# Patient Record
Sex: Male | Born: 1940 | Race: Asian | Hispanic: No | Marital: Married | Smoking: Former smoker
Health system: Southern US, Community
[De-identification: ages and names within clinical notes are randomized; demographics above are authoritative.]

## PROBLEM LIST (undated history)

## (undated) DIAGNOSIS — K219 Gastro-esophageal reflux disease without esophagitis: Secondary | ICD-10-CM

## (undated) DIAGNOSIS — I10 Essential (primary) hypertension: Secondary | ICD-10-CM

## (undated) DIAGNOSIS — E119 Type 2 diabetes mellitus without complications: Secondary | ICD-10-CM

## (undated) DIAGNOSIS — E781 Pure hyperglyceridemia: Secondary | ICD-10-CM

## (undated) DIAGNOSIS — C220 Liver cell carcinoma: Secondary | ICD-10-CM

## (undated) DIAGNOSIS — B181 Chronic viral hepatitis B without delta-agent: Secondary | ICD-10-CM

## (undated) DIAGNOSIS — D890 Polyclonal hypergammaglobulinemia: Secondary | ICD-10-CM

## (undated) DIAGNOSIS — H3530 Unspecified macular degeneration: Secondary | ICD-10-CM

## (undated) HISTORY — PX: ABDOMINAL SURGERY: SHX537

---

## 2017-05-10 ENCOUNTER — Ambulatory Visit
Admission: RE | Admit: 2017-05-10 | Discharge: 2017-05-10 | Disposition: A | Discharge status: Home / Self Care | Payer: Medicare Other | Source: Ambulatory Visit | Attending: Family Medicine | Admitting: Family Medicine

## 2017-05-10 ENCOUNTER — Other Ambulatory Visit: Payer: Self-pay | Admitting: Family Medicine

## 2017-05-10 DIAGNOSIS — M25551 Pain in right hip: Secondary | ICD-10-CM | POA: Diagnosis present

## 2019-12-01 ENCOUNTER — Emergency Department: Payer: Medicare Other

## 2019-12-01 ENCOUNTER — Emergency Department
Admission: EM | Admit: 2019-12-01 | Discharge: 2019-12-01 | Disposition: A | Discharge status: Home / Self Care | Payer: Medicare Other | Attending: Emergency Medicine | Admitting: Emergency Medicine

## 2019-12-01 ENCOUNTER — Other Ambulatory Visit: Payer: Self-pay

## 2019-12-01 DIAGNOSIS — Z8505 Personal history of malignant neoplasm of liver: Secondary | ICD-10-CM | POA: Insufficient documentation

## 2019-12-01 DIAGNOSIS — R109 Unspecified abdominal pain: Secondary | ICD-10-CM | POA: Diagnosis present

## 2019-12-01 DIAGNOSIS — K219 Gastro-esophageal reflux disease without esophagitis: Secondary | ICD-10-CM | POA: Diagnosis not present

## 2019-12-01 DIAGNOSIS — Z87891 Personal history of nicotine dependence: Secondary | ICD-10-CM | POA: Diagnosis not present

## 2019-12-01 DIAGNOSIS — I10 Essential (primary) hypertension: Secondary | ICD-10-CM | POA: Diagnosis not present

## 2019-12-01 DIAGNOSIS — E119 Type 2 diabetes mellitus without complications: Secondary | ICD-10-CM | POA: Diagnosis not present

## 2019-12-01 HISTORY — DX: Gastro-esophageal reflux disease without esophagitis: K21.9

## 2019-12-01 HISTORY — DX: Liver cell carcinoma: C22.0

## 2019-12-01 HISTORY — DX: Unspecified macular degeneration: H35.30

## 2019-12-01 HISTORY — DX: Polyclonal hypergammaglobulinemia: D89.0

## 2019-12-01 HISTORY — DX: Pure hyperglyceridemia: E78.1

## 2019-12-01 HISTORY — DX: Essential (primary) hypertension: I10

## 2019-12-01 HISTORY — DX: Type 2 diabetes mellitus without complications: E11.9

## 2019-12-01 HISTORY — DX: Chronic viral hepatitis B without delta-agent: B18.1

## 2019-12-01 LAB — URINALYSIS, COMPLETE (UACMP) WITH MICROSCOPIC
Bacteria, UA: NONE SEEN
Bilirubin Urine: NEGATIVE
Glucose, UA: >=500 mg/dL — AB
Hgb urine dipstick: NEGATIVE
Ketones, ur: NEGATIVE mg/dL
Leukocytes,Ua: NEGATIVE
Nitrite: NEGATIVE
Protein, ur: NEGATIVE mg/dL
Specific Gravity, Urine: 1.025 (ref 1.005–1.030)
Squamous Epithelial / HPF: NONE SEEN (ref 0–5)
pH: 6 (ref 5.0–8.0)

## 2019-12-01 LAB — COMPREHENSIVE METABOLIC PANEL
ALT: 34 U/L (ref 0–44)
AST: 33 U/L (ref 15–41)
Albumin: 4.2 g/dL (ref 3.5–5.0)
Alkaline Phosphatase: 92 U/L (ref 38–126)
Anion gap: 9 (ref 5–15)
BUN: 16 mg/dL (ref 8–23)
CO2: 25 mmol/L (ref 22–32)
Calcium: 9.2 mg/dL (ref 8.9–10.3)
Chloride: 100 mmol/L (ref 98–111)
Creatinine, Ser: 0.93 mg/dL (ref 0.61–1.24)
GFR calc Af Amer: >60 mL/min (ref >60)
GFR calc non Af Amer: >60 mL/min (ref >60)
Glucose, Bld: 236 mg/dL — ABNORMAL HIGH (ref 70–99)
Potassium: 4.1 mmol/L (ref 3.5–5.1)
Sodium: 134 mmol/L — ABNORMAL LOW (ref 135–145)
Total Bilirubin: 0.9 mg/dL (ref 0.3–1.2)
Total Protein: 8.5 g/dL — ABNORMAL HIGH (ref 6.5–8.1)

## 2019-12-01 LAB — CBC
HCT: 45 % (ref 39.0–52.0)
Hemoglobin: 16.1 g/dL (ref 13.0–17.0)
MCH: 30.4 pg (ref 26.0–34.0)
MCHC: 35.8 g/dL (ref 30.0–36.0)
MCV: 85.1 fL (ref 80.0–100.0)
Platelets: 245 10*3/uL (ref 150–400)
RBC: 5.29 MIL/uL (ref 4.22–5.81)
RDW: 12.7 % (ref 11.5–15.5)
WBC: 9.2 10*3/uL (ref 4.0–10.5)
nRBC: 0 % (ref 0.0–0.2)

## 2019-12-01 LAB — LIPASE, BLOOD: Lipase: 40 U/L (ref 11–51)

## 2019-12-01 MED ORDER — IOHEXOL 9 MG/ML PO SOLN
500.0000 mL | Freq: Once | ORAL | Status: AC
  Administered 2019-12-01: 500 mL via ORAL
  Filled 2019-12-01: qty 500

## 2019-12-01 MED ORDER — SODIUM CHLORIDE 0.9 % IV BOLUS
1000.0000 mL | Freq: Once | INTRAVENOUS | Status: AC
  Administered 2019-12-01: 1000 mL via INTRAVENOUS

## 2019-12-01 MED ORDER — DICYCLOMINE HCL 10 MG PO CAPS: 10.0000 mg | ORAL_CAPSULE | Freq: Three times a day (TID) | ORAL | 0 refills | Status: AC

## 2019-12-01 MED ORDER — IOHEXOL 300 MG/ML  SOLN
100.0000 mL | Freq: Once | INTRAMUSCULAR | Status: AC | PRN
  Administered 2019-12-01: 75 mL via INTRAVENOUS
  Filled 2019-12-01: qty 100

## 2019-12-01 NOTE — ED Triage Notes (Signed)
Pt c/o generalized abd pain for the past 3 days, denies N/V/D.Marland Kitchen denies any constipation or abd swelling.

## 2019-12-01 NOTE — ED Provider Notes (Signed)
[facility] Emergency Department Provider Note  Time seen: 1:19 PM  I have reviewed the triage vital signs and the nursing notes.   HISTORY  Chief Complaint Abdominal Pain   HPI Brian Warner is a 79 y.o. male with a past medical history of diabetes, gastric reflux, hypertension, presents to the emergency department for left-sided abdominal pain.  According to the patient for the past 10 days or so he has been experiencing moderate dull aching pain along the left lower quadrant and now extending up to the left upper quadrant.  Patient denies any nausea vomiting or diarrhea.  No fever.  Last bowel movement was yesterday and normal per patient.  Patient does have a history of hepatocellular carcinoma status post partial resection approximately 5 years ago.  Currently rates his pain as a 5/10 dull aching pain.   Past Medical History:  Diagnosis Date  . 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 Diabetes mellitus without complication ([place])   . GERD (gastroesophageal reflux disease)   . Hepatocellular carcinoma ([place])   . Hypertension   . Hypertriglyceridemia   . Macular degeneration   . Polyclonal gammopathy     There are no problems to display for this patient.   Past Surgical History:  Procedure Laterality Date  . ABDOMINAL SURGERY     cancer removal     Prior to Admission medications   Not on File    Allergies  Allergen Reactions  . Statins Other (See Comments)    Liver cirrhosis  . Azithromycin Rash  . Fenofibrate Rash    No family history on file.  Social History Social History   Tobacco Use  . Smoking status: Former Research scientist (life sciences)  . Smokeless tobacco: Never Used  Substance Use Topics  . Alcohol use: Not Currently  . Drug use: Not Currently    Review of Systems Constitutional: Negative for fever. Cardiovascular: Negative for chest pain. Respiratory: Negative for shortness of breath. Gastrointestinal: Negative for abdominal pain,  vomiting  Musculoskeletal: Negative for musculoskeletal complaints Neurological: Negative for headache All other ROS negative  ____________________________________________   PHYSICAL EXAM:  VITAL SIGNS: ED Triage Vitals  Enc Vitals Group     BP 12/01/19 1036 (!) 153/90     Pulse Rate 12/01/19 1036 88     Resp 12/01/19 1036 16     Temp 12/01/19 1036 97.7 F (36.5 C)     Temp Source 12/01/19 1036 Oral     SpO2 12/01/19 1036 97 %     Weight 12/01/19 1037 123 lb (55.8 kg)     Height 12/01/19 1037 5\' 3"  (1.6 m)     Head Circumference --      Peak Flow --      Pain Score 12/01/19 1037 8     Pain Loc --      Pain Edu? --      Excl. in [place]? --    Constitutional: Alert and oriented. Well appearing and in no distress. Eyes: Normal exam ENT      Head: Normocephalic and atraumatic.      Mouth/Throat: Mucous membranes are moist. Cardiovascular: Normal rate, regular rhythm. No murmur Respiratory: Normal respiratory effort without tachypnea nor retractions. Breath sounds are clear Gastrointestinal: Soft and nontender. No distention.   Musculoskeletal: Nontender with normal range of motion in all extremities.  Neurologic:  Normal speech and language. No gross focal neurologic deficits  Skin:  Skin is warm, dry and intact.  Psychiatric: Mood and affect  are normal.   ____________________________________________    EKG  EKG viewed and interpreted by myself shows normal sinus rhythm 85 bpm with a narrow QRS, normal axis, normal intervals, no concerning ST changes.  ____________________________________________    RADIOLOGY  IMPRESSION:  1. Nodular hepatic margins are noted suggesting hepatic cirrhosis.  2. No other abnormality seen in the abdomen or pelvis.  3. Aortic atherosclerosis.   ____________________________________________   INITIAL IMPRESSION / ASSESSMENT AND PLAN / ED COURSE  Pertinent labs & imaging results that were available during my care of the patient were  reviewed by me and considered in my medical decision making (see chart for details).   Patient presents emergency department for left lower quadrant/left-sided abdominal pain ongoing for 10 days.  Differential would include colitis, diverticulitis, ureterolithiasis, UTI pyelonephritis, possible cancerous or tumor.  We will obtain CT scan with contrast abdomen/pelvis to further evaluate.  Patient agreeable to plan of care.  Patient CT scan is negative.  Overall patient appears extremely well.  We will discharge with Bentyl and have the patient follow-up with his doctor.  Patient agreeable to plan of care.  Brian Warner was evaluated in Emergency Department on 12/01/2019 for the symptoms described in the history of present illness. He was evaluated in the context of the global COVID-19 pandemic, which necessitated consideration that the patient might be at risk for infection with the SARS-CoV-2 virus that causes COVID-19. Institutional protocols and algorithms that pertain to the evaluation of patients at risk for COVID-19 are in a state of rapid change based on information released by regulatory bodies including the CDC and federal and state organizations. These policies and algorithms were followed during the patient's care in the ED.  ____________________________________________   FINAL CLINICAL IMPRESSION(S) / ED DIAGNOSES  Left-sided abdominal pain   Harvest Dark, MD 12/01/19 1555

## 2021-03-17 IMAGING — CT CT ABD-PELV W/ CM
2 of 5 series · 16 of 46 positions shown, 18 images · IV contrast (APPLIED)
Comparison: None.

CLINICAL DATA: Acute generalized abdominal pain.

EXAM:
CT ABDOMEN AND PELVIS WITH CONTRAST
TECHNIQUE: Multidetector CT imaging of the abdomen and pelvis was performed
using the standard protocol following bolus administration of
intravenous contrast.
CONTRAST:  75mL OMNIPAQUE IOHEXOL 300 MG/ML  SOLN

[Series 2: routine abd/pel with · axial · 0.73mm/px · z∈[-779,-399]mm · 13 of 86 slices shown, 15 images]
[im 5/86  soft-tissue]
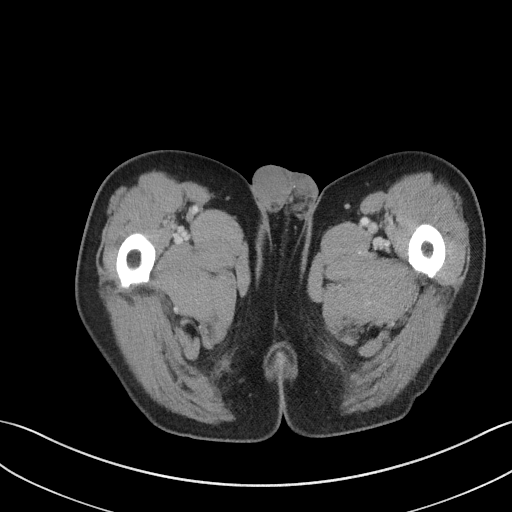
[im 5/86  bone]
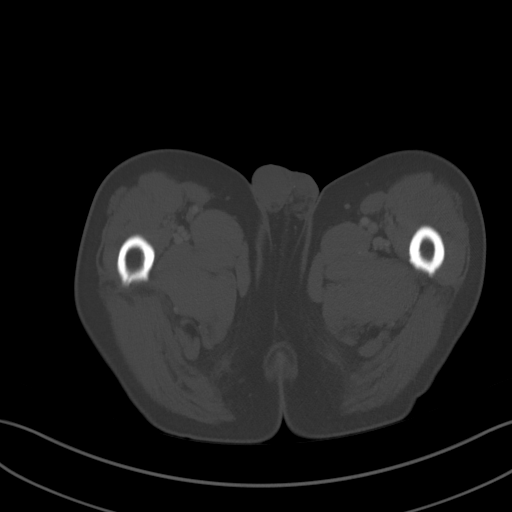
[im 10/86  soft-tissue]
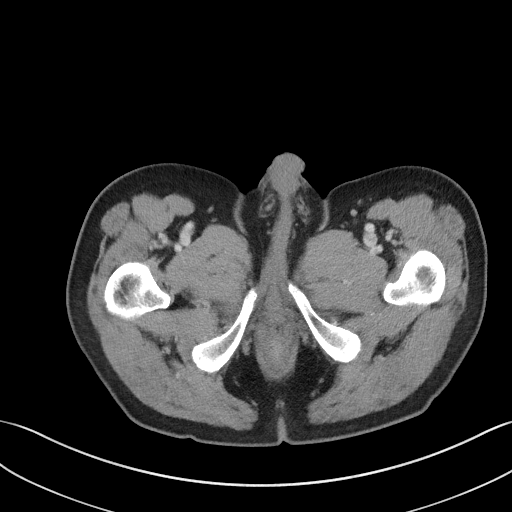
[im 19/86  soft-tissue]
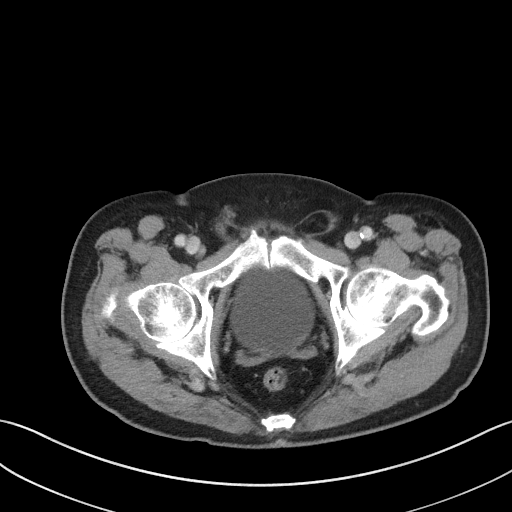
[im 24/86  soft-tissue]
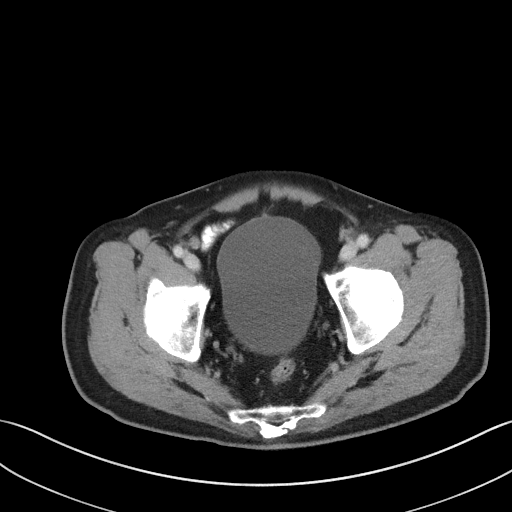
[im 29/86  soft-tissue]
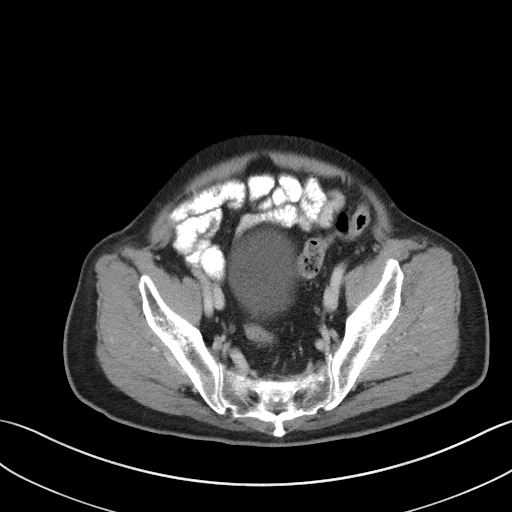
[im 38/86  soft-tissue]
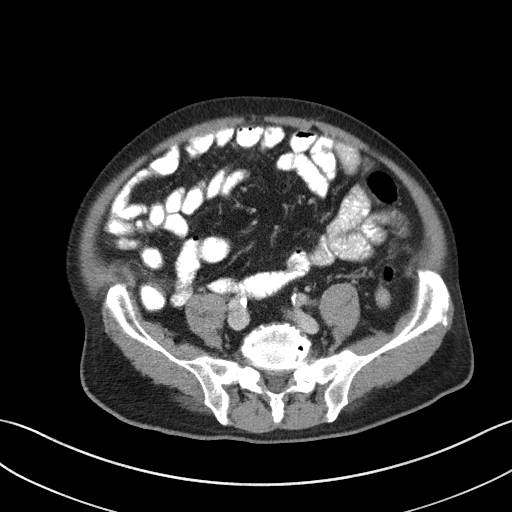
[im 43/86  soft-tissue]
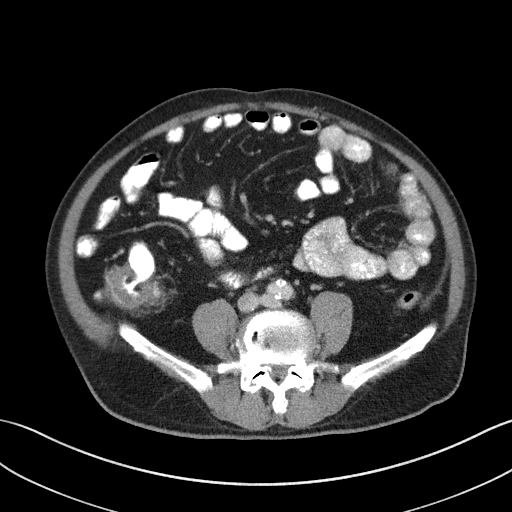
[im 48/86  soft-tissue]
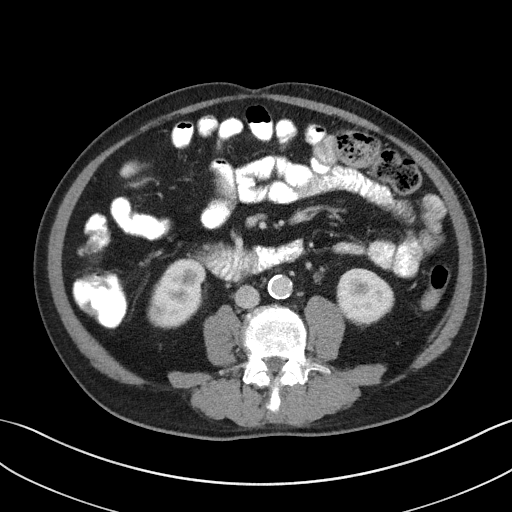
[im 57/86  soft-tissue]
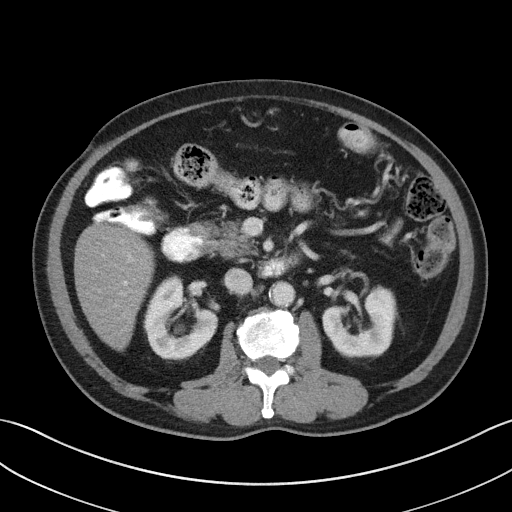
[im 57/86  bone]
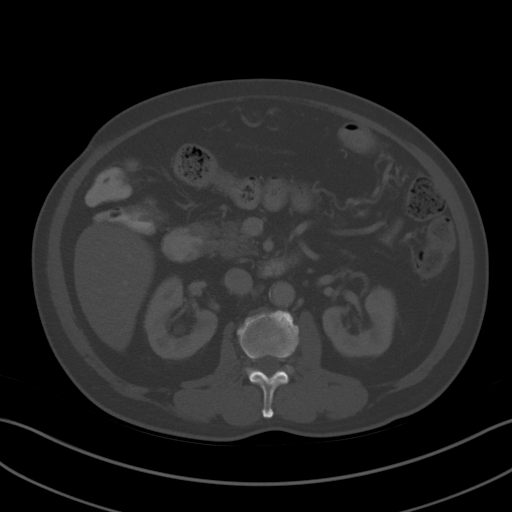
[im 62/86  soft-tissue]
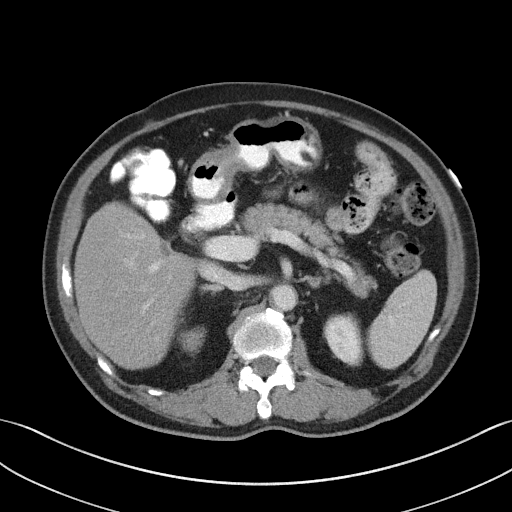
[im 67/86  soft-tissue]
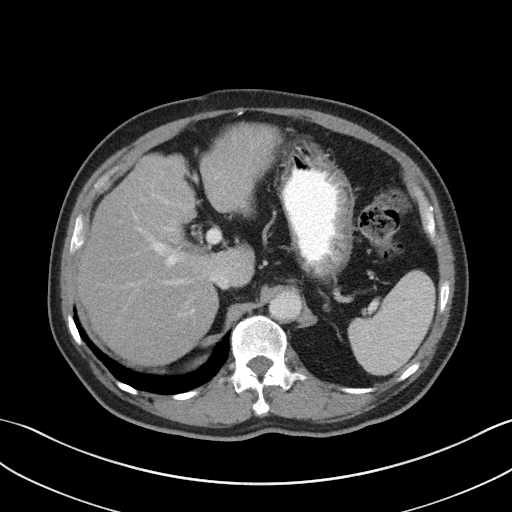
[im 76/86  soft-tissue]
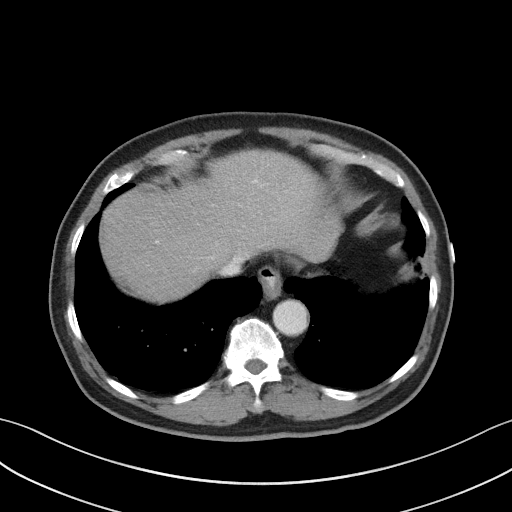
[im 81/86  soft-tissue]
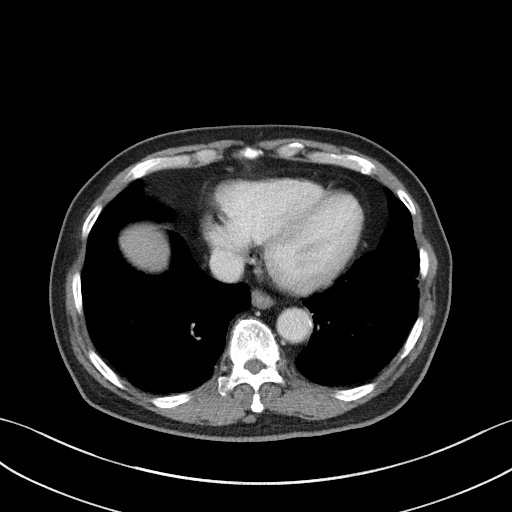

[Series 5: coronal st · coronal · 0.67mm/px · 3 of 92 slices shown]
[im 31/92  soft-tissue]
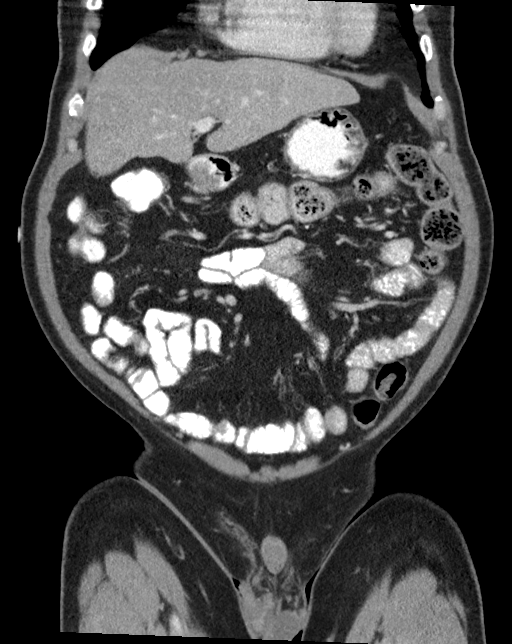
[im 41/92  soft-tissue]
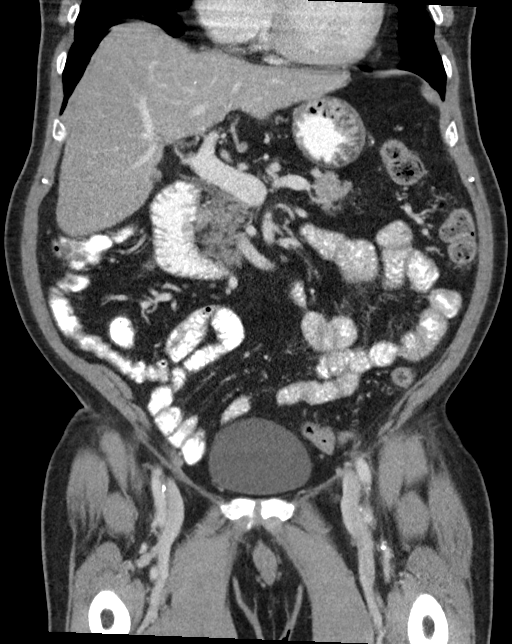
[im 51/92  soft-tissue]
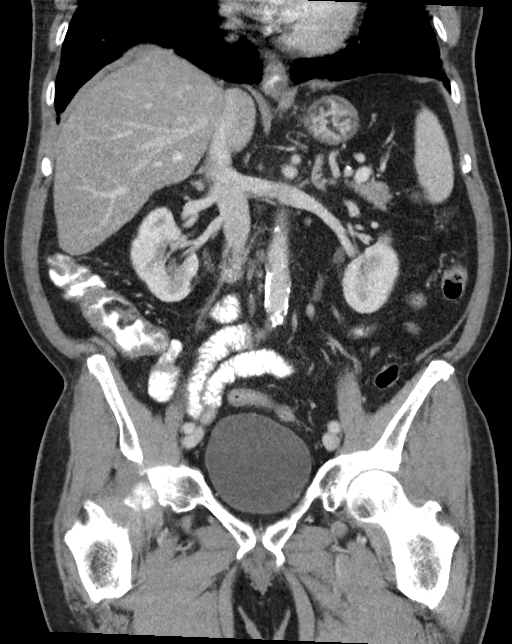

[16 of 46 positions shown; findings below may reference images not displayed]

FINDINGS: Lower chest: No acute abnormality.

Hepatobiliary: No biliary dilatation is noted. Patient appears to be
status post cholecystectomy. Nodular hepatic margins are noted
suggesting hepatic cirrhosis. No definite focal hepatic abnormality
is noted.

Pancreas: Unremarkable. No pancreatic ductal dilatation or
surrounding inflammatory changes.

Spleen: Normal in size without focal abnormality.

Adrenals/Urinary Tract: Adrenal glands are unremarkable. Kidneys are
normal, without renal calculi, focal lesion, or hydronephrosis.
Bladder is unremarkable.

Stomach/Bowel: Stomach is within normal limits. Appendix appears
normal. No evidence of bowel wall thickening, distention, or
inflammatory changes.

Vascular/Lymphatic: Aortic atherosclerosis. No enlarged abdominal or
pelvic lymph nodes.

Reproductive: Prostate is unremarkable.

Other: No abdominal wall hernia or abnormality. No abdominopelvic
ascites.

Musculoskeletal: No acute or significant osseous findings.
IMPRESSION: 1. Nodular hepatic margins are noted suggesting hepatic cirrhosis.
2. No other abnormality seen in the abdomen or pelvis.
3. Aortic atherosclerosis.

Aortic Atherosclerosis ([id number]).
